# Patient Record
Sex: Female | Born: 1950 | Race: Black or African American | Hispanic: No | Marital: Single | Smoking: Never smoker
Health system: Southern US, Community
[De-identification: ages and names within clinical notes are randomized; demographics above are authoritative.]

## PROBLEM LIST (undated history)

## (undated) DIAGNOSIS — I10 Essential (primary) hypertension: Secondary | ICD-10-CM

## (undated) HISTORY — PX: ABDOMINAL HYSTERECTOMY: SHX81

---

## 1979-11-13 HISTORY — PX: TUBAL LIGATION: SHX77

## 2018-04-26 ENCOUNTER — Other Ambulatory Visit: Payer: Self-pay

## 2018-04-26 ENCOUNTER — Emergency Department (HOSPITAL_COMMUNITY): Payer: Medicare Other

## 2018-04-26 ENCOUNTER — Encounter (HOSPITAL_COMMUNITY): Payer: Self-pay

## 2018-04-26 ENCOUNTER — Emergency Department (HOSPITAL_COMMUNITY)
Admission: EM | Admit: 2018-04-26 | Discharge: 2018-04-26 | Disposition: A | Discharge status: Home / Self Care | Payer: Medicare Other | Attending: Emergency Medicine | Admitting: Emergency Medicine

## 2018-04-26 DIAGNOSIS — R55 Syncope and collapse: Secondary | ICD-10-CM

## 2018-04-26 DIAGNOSIS — I951 Orthostatic hypotension: Secondary | ICD-10-CM

## 2018-04-26 DIAGNOSIS — I10 Essential (primary) hypertension: Secondary | ICD-10-CM | POA: Insufficient documentation

## 2018-04-26 DIAGNOSIS — R569 Unspecified convulsions: Secondary | ICD-10-CM

## 2018-04-26 HISTORY — DX: Essential (primary) hypertension: I10

## 2018-04-26 LAB — CBC WITH DIFFERENTIAL/PLATELET
Abs Immature Granulocytes: 0.02 10*3/uL (ref 0.00–0.07)
BASOS PCT: 1 %
Basophils Absolute: 0 10*3/uL (ref 0.0–0.1)
EOS ABS: 0.1 10*3/uL (ref 0.0–0.5)
Eosinophils Relative: 2 %
HCT: 44.9 % (ref 36.0–46.0)
Hemoglobin: 14.2 g/dL (ref 12.0–15.0)
Immature Granulocytes: 0 %
Lymphocytes Relative: 17 %
Lymphs Abs: 0.9 10*3/uL (ref 0.7–4.0)
MCH: 27.7 pg (ref 26.0–34.0)
MCHC: 31.6 g/dL (ref 30.0–36.0)
MCV: 87.5 fL (ref 80.0–100.0)
Monocytes Absolute: 0.3 10*3/uL (ref 0.1–1.0)
Monocytes Relative: 6 %
NRBC: 0 % (ref 0.0–0.2)
Neutro Abs: 3.7 10*3/uL (ref 1.7–7.7)
Neutrophils Relative %: 74 %
PLATELETS: 225 10*3/uL (ref 150–400)
RBC: 5.13 MIL/uL — ABNORMAL HIGH (ref 3.87–5.11)
RDW: 13 % (ref 11.5–15.5)
WBC: 5 10*3/uL (ref 4.0–10.5)

## 2018-04-26 LAB — BASIC METABOLIC PANEL
Anion gap: 15 (ref 5–15)
BUN: 16 mg/dL (ref 8–23)
CO2: 22 mmol/L (ref 22–32)
CREATININE: 1.23 mg/dL — AB (ref 0.44–1.00)
Calcium: 9.1 mg/dL (ref 8.9–10.3)
Chloride: 103 mmol/L (ref 98–111)
GFR calc Af Amer: 53 mL/min — ABNORMAL LOW (ref >60)
GFR calc non Af Amer: 46 mL/min — ABNORMAL LOW (ref >60)
Glucose, Bld: 109 mg/dL — ABNORMAL HIGH (ref 70–99)
Potassium: 3.6 mmol/L (ref 3.5–5.1)
Sodium: 140 mmol/L (ref 135–145)

## 2018-04-26 LAB — URINALYSIS, ROUTINE W REFLEX MICROSCOPIC
Bilirubin Urine: NEGATIVE
Glucose, UA: NEGATIVE mg/dL
Ketones, ur: 80 mg/dL — AB
Nitrite: NEGATIVE
PROTEIN: 30 mg/dL — AB
Specific Gravity, Urine: 1.025 (ref 1.005–1.030)
pH: 5 (ref 5.0–8.0)

## 2018-04-26 LAB — CBG MONITORING, ED: Glucose-Capillary: 100 mg/dL — ABNORMAL HIGH (ref 70–99)

## 2018-04-26 LAB — I-STAT TROPONIN, ED: Troponin i, poc: 0 ng/mL (ref 0.00–0.08)

## 2018-04-26 MED ORDER — SODIUM CHLORIDE 0.9 % IV BOLUS
1000.0000 mL | Freq: Once | INTRAVENOUS | Status: AC
  Administered 2018-04-26: 1000 mL via INTRAVENOUS

## 2018-04-26 MED ORDER — SODIUM CHLORIDE 0.9 % IV BOLUS: 1000.0000 mL | Freq: Once | INTRAVENOUS | Status: DC

## 2018-04-26 NOTE — Discharge Instructions (Signed)
You came in due to fainting and probable seizure like activity. Your head CT scan and blood work were reassuring.  Please make sure to be seen by a neurologist.  Per [facility] [place] DM[place] statutes, patients with seizures are not allowed to drive until they have been seizure-free for six months. Please avoid driving for at least 6 months or until cleared by neurologist. Please eat and drink enough to avoid ow blood pressure or sugar. Return to emergency room if not improved or developed seizure, loss of consciousness, or other symptoms such as sever headache, chest pain or shortness of breath. Thanks

## 2018-04-26 NOTE — ED Triage Notes (Signed)
Pt states that she was lightheaded this morning and went to sit down. Pt remembers waking up on the floor with niece calling her name. Pt did not eat yesterday. Pt's niece witnessed incidence .Pt fell out of the chair onto the carpet. Pt was staring "like she wasn't there", like a focal seizure per niece. Pt did void on herself. Pt was diaphoretic per niece.  No hx of seizures

## 2018-04-26 NOTE — ED Notes (Signed)
Pt ambulated to restroom without assistance.

## 2018-04-26 NOTE — ED Provider Notes (Signed)
[facility]-EMERGENCY DEPT Provider Note   CSN: [id number] Arrival date & time: 04/26/18  1042   History   Chief Complaint Chief Complaint  Patient presents with  . Loss of Consciousness    HPI Aimee Johnson is a 67 y.o. female.  With past medical history of HTN, presented with fainting spell. She was at her niece's home, today when it happened.  She initially felt some generalized weakness and then fell on the floor.  Per her niece who is a IT sales professional, she found that her to have staring spell when on the floor, looking at her unresponsive.  She did not have any tonic-clonic movement but passed urine.  When she called her name, she got responsive and asked her why she is on the floor.  She was oriented but took about 1 to 2 minutes for her to back to herself. she did not hit her head.  Denies any  chest pain or . No palpitation, dizziness.  No nausea, vomiting or abdominal pain. She did not have any recent travel, leg pain or leg swelling or shortness of breath. She tries to have a healthy diet and endorses that she did not eat yesterday because she did not have any food available other than junk food.    HPI  Past Medical History:  Diagnosis Date  . Hypertension     There are no active problems to display for this patient.   History reviewed. No pertinent surgical history.   OB History   No obstetric history on file.      Home Medications    Prior to Admission medications   Not on File    Family History No family history on file.  Social History Social History   Tobacco Use  . Smoking status: Never Smoker  . Smokeless tobacco: Never Used  Substance Use Topics  . Alcohol use: Never    Frequency: Never  . Drug use: Never     Allergies   Patient has no known allergies.   Review of Systems Review of Systems   Physical Exam Updated Vital Signs BP (!) 153/85   Pulse 72   Temp 97.8 F (36.6 C) (Oral)   Resp 10   Ht 5\' 6"   (1.676 m)   Wt 71.7 kg   SpO2 100%   BMI 25.50 kg/m   Physical Exam Vitals signs and nursing note reviewed.  Constitutional:      General: She is not in acute distress.    Appearance: Normal appearance.  HENT:     Head: Normocephalic and atraumatic.  Eyes:     Extraocular Movements: Extraocular movements intact.     Pupils: Pupils are equal, round, and reactive to light.  Cardiovascular:     Rate and Rhythm: Normal rate and regular rhythm.     Heart sounds: No murmur.  Abdominal:     General: Bowel sounds are normal.     Palpations: Abdomen is soft.     Tenderness: There is no abdominal tenderness.  Musculoskeletal: Normal range of motion.  Neurological:     General: No focal deficit present.     Mental Status: She is alert and oriented to person, place, and time.  Psychiatric:        Mood and Affect: Mood normal.        Behavior: Behavior normal.      ED Treatments / Results  Labs (all labs ordered are listed, but only abnormal results are displayed) Labs Reviewed  BASIC  METABOLIC PANEL - Abnormal; Notable for the following components:      Result Value   Glucose, Bld 109 (*)    Creatinine, Ser 1.23 (*)    GFR calc non Af Amer 46 (*)    GFR calc Af Amer 53 (*)    All other components within normal limits  URINALYSIS, ROUTINE W REFLEX MICROSCOPIC - Abnormal; Notable for the following components:   APPearance HAZY (*)    Hgb urine dipstick MODERATE (*)    Ketones, ur 80 (*)    Protein, ur 30 (*)    Leukocytes, UA TRACE (*)    Bacteria, UA RARE (*)    All other components within normal limits  CBC WITH DIFFERENTIAL/PLATELET - Abnormal; Notable for the following components:   RBC 5.13 (*)    All other components within normal limits  CBG MONITORING, ED - Abnormal; Notable for the following components:   Glucose-Capillary 100 (*)    All other components within normal limits  I-STAT TROPONIN, ED    EKG None  Radiology Ct Head Wo Contrast  Result Date:  04/26/2018 CLINICAL DATA:  Syncope. EXAM: CT HEAD WITHOUT CONTRAST TECHNIQUE: Contiguous axial images were obtained from the base of the skull through the vertex without intravenous contrast. COMPARISON:  None. FINDINGS: Brain: No evidence of acute infarction, hemorrhage, hydrocephalus, extra-axial collection or mass lesion/mass effect. Vascular: No hyperdense vessel or unexpected calcification. Skull: No evidence for fracture. No worrisome lytic or sclerotic lesion. Sinuses/Orbits: The visualized paranasal sinuses and mastoid air cells are clear. Visualized portions of the globes and intraorbital fat are unremarkable. Other: None. IMPRESSION: No acute intracranial abnormality. Electronically Signed   By: Kennith CenterEric  Mansell M.D.   On: 04/26/2018 13:38    Procedures Procedures (including critical care time)  Medications Ordered in ED Medications  sodium chloride 0.9 % bolus 1,000 mL (0 mLs Intravenous Stopped 04/26/18 1326)     Initial Impression / Assessment and Plan / ED Course  I have reviewed the triage vital signs and the nursing notes.  Pertinent labs & imaging results that were available during my care of the patient were reviewed by me and considered in my medical decision making (see chart for details).     67 y/o F with PMHx of HTN, presented with near syncope. History is significant for stairing spell and urinary incontinency. Concerning for possible seizure.  Electrolyte normal. Has +orthostatic vs. And reports that she did not eat yesterday.   -NS 1 li bolus -CT head -CBG  -istat trop--> 0.0 -EKG -orthostatic vs -U/A Update: Remained stable and symptoms free. EKG and Trop negative. No electrolyte abnormality. CT scan with NO acute intracranial abnormality. Result discussed with her and her family.  Her symptoms might be due to seizure like activity versus orthostatic hypotension.  ED return precautions provided. They agree to be discharge and follow up with neurology. Provided  information about Seizure precautions, and avoiding driving for at least  6 months or until be cleared by neurology.    Final Clinical Impressions(s) / ED Diagnoses   Final diagnoses:  Near syncope  Orthostasis  Seizure-like activity [facility](HCC)    ED Discharge Orders    None       Chevis PrettyMasoudi, Minnette Merida, MD 04/26/18 1504    Charlynne PanderYao, David Hsienta, MD 04/28/18 0630

## 2018-04-26 NOTE — ED Notes (Signed)
Patient transported to CT 

## 2018-04-26 NOTE — ED Notes (Signed)
Patient unable to urinate at this time. Patient will call out when ready to void.

## 2018-04-29 ENCOUNTER — Ambulatory Visit (INDEPENDENT_AMBULATORY_CARE_PROVIDER_SITE_OTHER): Payer: Medicare Other | Admitting: Diagnostic Neuroimaging

## 2018-04-29 ENCOUNTER — Encounter: Payer: Self-pay | Admitting: Diagnostic Neuroimaging

## 2018-04-29 ENCOUNTER — Telehealth: Payer: Self-pay | Admitting: Diagnostic Neuroimaging

## 2018-04-29 VITALS — BP 161/88 | HR 82 | Ht 66.0 in | Wt 168.4 lb

## 2018-04-29 DIAGNOSIS — R4020 Unspecified coma: Secondary | ICD-10-CM | POA: Diagnosis not present

## 2018-04-29 DIAGNOSIS — R55 Syncope and collapse: Secondary | ICD-10-CM

## 2018-04-29 DIAGNOSIS — R6889 Other general symptoms and signs: Secondary | ICD-10-CM

## 2018-04-29 NOTE — Telephone Encounter (Signed)
Medicare/aarp order sent to GI. No auth they will reach out to the pt to schedule.  °

## 2018-04-29 NOTE — Progress Notes (Signed)
[facility]  PATIENT: Aimee Johnson DOB: [birth year]  REFERRING CLINICIAN: ER  HISTORY FROM: patient  REASON FOR VISIT: new consult    HISTORICAL  CHIEF COMPLAINT:  Chief Complaint  Patient presents with  . Seizures    rm 6, New Pt, "I fainted on 04/26/18, had urinary incontinence, was taken to ED to be checked"    HISTORY OF PRESENT ILLNESS:   67 year old female here for evaluation of syncope.  04/26/2018 patient was at home, went to sit down, was noted to be staring and confused.  Apparently she fell out of the chair, had eyes open and staring.  She had urinary incontinence.  Patient was sweating profusely.  Return to consciousness within a few minutes.  This was witnessed by her niece who is an EMT, who was concerned it may have been a seizure. No witnessed tonic or clonic activity. Patient was taken to ER: 04/26/18: ortho vitals: 126/79, 70 lying --> 108/84, 98 standing.  Patient was diagnosed with possible dehydration as cause for seizure.  Of note patient did not have anything to eat the day before this event happened.  Since that time patient is doing well.  No further events.  No prior history of syncope or seizure.    REVIEW OF SYSTEMS: Full 14 system review of systems performed and negative with exception of: Only as per HPI.  ALLERGIES: No Known Allergies  HOME MEDICATIONS: Outpatient Medications Prior to Visit  Medication Sig Dispense Refill  . Multiple Vitamin (MULTIVITAMIN) tablet Take by mouth daily.    Marland Kitchen UNABLE TO FIND Med Name: tumeric     No facility-administered medications prior to visit.     PAST MEDICAL HISTORY: Past Medical History:  Diagnosis Date  . Hypertension     PAST SURGICAL HISTORY: Past Surgical History:  Procedure Laterality Date  . ABDOMINAL HYSTERECTOMY    . TUBAL LIGATION  11/1979    FAMILY HISTORY: Family History  Problem Relation Age of Onset  . Diabetes Father   . Cancer Brother     SOCIAL  HISTORY: Social History   Socioeconomic History  . Marital status: Single    Spouse name: Not on file  . Number of children: 3  . Years of education: Not on file  . Highest education level: Bachelor's degree (e.g., BA, AB, BS)  Occupational History    Comment: retired  Engineer, production  . Financial resource strain: Not on file  . Food insecurity:    Worry: Not on file    Inability: Not on file  . Transportation needs:    Medical: Not on file    Non-medical: Not on file  Tobacco Use  . Smoking status: Never Smoker  . Smokeless tobacco: Never Used  Substance and Sexual Activity  . Alcohol use: Never    Frequency: Never  . Drug use: Never  . Sexual activity: Not on file  Lifestyle  . Physical activity:    Days per week: Not on file    Minutes per session: Not on file  . Stress: Not on file  Relationships  . Social connections:    Talks on phone: Not on file    Gets together: Not on file    Attends religious service: Not on file    Active member of club or organization: Not on file    Attends meetings of clubs or organizations: Not on file    Relationship status: Not on file  . Intimate partner violence:    Fear of  current or ex partner: Not on file    Emotionally abused: Not on file    Physically abused: Not on file    Forced sexual activity: Not on file  Other Topics Concern  . Not on file  Social History Narrative   Live with daughters on [place], with niece now 04/29/18   Caffeine- herbal tea     PHYSICAL EXAM  GENERAL EXAM/CONSTITUTIONAL: Vitals:  Vitals:   04/29/18 1102  BP: (!) 161/88  Pulse: 82  Weight: 168 lb 6.4 oz (76.4 kg)  Height: 5\' 6"  (1.676 m)    No data found.      Body mass index is 27.18 kg/m. Wt Readings from Last 3 Encounters:  04/29/18 168 lb 6.4 oz (76.4 kg)  04/26/18 158 lb (71.7 kg)     Patient is in no distress; well developed, nourished and groomed; neck is supple  CARDIOVASCULAR:  Examination of carotid arteries  is normal; no carotid bruits  Regular rate and rhythm, no murmurs  Examination of peripheral vascular system by observation and palpation is normal  EYES:  Ophthalmoscopic exam of optic discs and posterior segments is normal; no papilledema or hemorrhages  Visual Acuity Screening   Right eye Left eye Both eyes  Without correction: 20/30 20/40   With correction:        MUSCULOSKELETAL:  Gait, strength, tone, movements noted in Neurologic exam below  NEUROLOGIC: MENTAL STATUS:  No flowsheet data found.  awake, alert, oriented to person, place and time  recent and remote memory intact  normal attention and concentration  language fluent, comprehension intact, naming intact  fund of knowledge appropriate  CRANIAL NERVE:   2nd - no papilledema on fundoscopic exam  2nd, 3rd, 4th, 6th - pupils equal and reactive to light, visual fields full to confrontation, extraocular muscles intact, no nystagmus  5th - facial sensation symmetric  7th - facial strength symmetric  8th - hearing intact  9th - palate elevates symmetrically, uvula midline  11th - shoulder shrug symmetric  12th - tongue protrusion midline  MOTOR:   normal bulk and tone, full strength in the BUE, BLE  SENSORY:   normal and symmetric to light touch, temperature, vibration  COORDINATION:   finger-nose-finger, fine finger movements normal  REFLEXES:   deep tendon reflexes present and symmetric  GAIT/STATION:   narrow based gait; romberg is negative     DIAGNOSTIC DATA (LABS, IMAGING, TESTING) - I reviewed patient records, labs, notes, testing and imaging myself where available.  Lab Results  Component Value Date   WBC 5.0 04/26/2018   HGB 14.2 04/26/2018   HCT 44.9 04/26/2018   MCV 87.5 04/26/2018   PLT 225 04/26/2018      Component Value Date/Time   NA 140 04/26/2018 1126   K 3.6 04/26/2018 1126   CL 103 04/26/2018 1126   CO2 22 04/26/2018 1126   GLUCOSE 109 (H)  04/26/2018 1126   BUN 16 04/26/2018 1126   CREATININE 1.23 (H) 04/26/2018 1126   CALCIUM 9.1 04/26/2018 1126   GFRNONAA 46 (L) 04/26/2018 1126   GFRAA 53 (L) 04/26/2018 1126   No results found for: CHOL, HDL, LDLCALC, LDLDIRECT, TRIG, CHOLHDL No results found for: RUEA5W No results found for: VITAMINB12 No results found for: TSH   04/26/18 CT head [I reviewed images myself and agree with interpretation. -VRP]  - No acute intracranial abnormality.    ASSESSMENT AND PLAN  67 y.o. year old female here with new onset loss of consciousness,  seizure versus syncope.  Will complete work-up.  Dx:  1. Syncope, unspecified syncope type   2. Loss of consciousness (HCC)   3. Other general symptoms and signs      PLAN:  UNPROVOKED SEIZURE vs SYNCOPE - CBC, CMP, B12, A1c, TSH - MRI brain, EEG - According to [place] law, you can not drive unless you are seizure / syncope free for at least 6 months and under physician's care.  - Please maintain precautions. Do not participate in activities where a loss of awareness could harm you or someone else. No swimming alone, no tub bathing, no hot tubs, no driving, no operating motorized vehicles (cars, ATVs, motocycles, etc), lawnmowers, power tools or firearms. No standing at heights, such as rooftops, ladders or stairs. Avoid hot objects such as stoves, heaters, open fires. Wear a helmet when riding a bicycle, scooter, skateboard, etc. and avoid areas of traffic. Set your water heater to 120 degrees or less.  HYPERTENSION - follow up with PCP  RENAL INSUFFIENCEY  - follow up with PCP  Orders Placed This Encounter  Procedures  . MR BRAIN W WO CONTRAST  . CBC with Differential/Platelet  . Comprehensive metabolic panel  . Hemoglobin A1c  . Vitamin B12  . TSH  . EEG adult   Return in about 6 months (around 10/29/2018).  I reviewed images, labs, notes, records myself. I summarized findings and reviewed with patient, for this high risk condition  (syncope / seizure) requiring high complexity decision making.     Suanne MarkerVIKRAM R. , MD 04/29/2018, 11:37 AM Certified in Neurology, Neurophysiology and Neuroimaging  [facility] [address], [address] [place], [place] [address] [phone]

## 2018-04-29 NOTE — Patient Instructions (Signed)
-   CBC, CMP, B12, A1c, TSH  - MRI brain, EEG  - According to [place] law, you can not drive unless you are seizure / syncope free for at least 6 months and under physician's care.   - Please maintain precautions. Do not participate in activities where a loss of awareness could harm you or someone else. No swimming alone, no tub bathing, no hot tubs, no driving, no operating motorized vehicles (cars, ATVs, motocycles, etc), lawnmowers, power tools or firearms. No standing at heights, such as rooftops, ladders or stairs. Avoid hot objects such as stoves, heaters, open fires. Wear a helmet when riding a bicycle, scooter, skateboard, etc. and avoid areas of traffic. Set your water heater to 120 degrees or less.

## 2018-04-29 NOTE — Telephone Encounter (Signed)
lvm for patient to be aware I gave her GI phone number of [phone] and to give them a call if she has not heard in the next 2-3 business days.

## 2018-04-30 LAB — COMPREHENSIVE METABOLIC PANEL
ALT: 10 IU/L (ref 0–32)
AST: 17 IU/L (ref 0–40)
Albumin/Globulin Ratio: 1.8 (ref 1.2–2.2)
Albumin: 4.1 g/dL (ref 3.6–4.8)
Alkaline Phosphatase: 51 IU/L (ref 39–117)
BUN/Creatinine Ratio: 13 (ref 12–28)
BUN: 11 mg/dL (ref 8–27)
Bilirubin Total: <0.2 mg/dL (ref 0.0–1.2)
CALCIUM: 8.9 mg/dL (ref 8.7–10.3)
CO2: 23 mmol/L (ref 20–29)
Chloride: 106 mmol/L (ref 96–106)
Creatinine, Ser: 0.82 mg/dL (ref 0.57–1.00)
GFR, EST AFRICAN AMERICAN: 86 mL/min/{1.73_m2} (ref >59)
GFR, EST NON AFRICAN AMERICAN: 75 mL/min/{1.73_m2} (ref >59)
Globulin, Total: 2.3 g/dL (ref 1.5–4.5)
Glucose: 104 mg/dL — ABNORMAL HIGH (ref 65–99)
Potassium: 4.8 mmol/L (ref 3.5–5.2)
Sodium: 142 mmol/L (ref 134–144)
Total Protein: 6.4 g/dL (ref 6.0–8.5)

## 2018-04-30 LAB — CBC WITH DIFFERENTIAL/PLATELET
BASOS: 1 %
Basophils Absolute: 0 10*3/uL (ref 0.0–0.2)
EOS (ABSOLUTE): 0.1 10*3/uL (ref 0.0–0.4)
EOS: 2 %
Hematocrit: 36.6 % (ref 34.0–46.6)
Hemoglobin: 11.7 g/dL (ref 11.1–15.9)
Immature Grans (Abs): 0 10*3/uL (ref 0.0–0.1)
Immature Granulocytes: 0 %
Lymphocytes Absolute: 1.4 10*3/uL (ref 0.7–3.1)
Lymphs: 35 %
MCH: 27.5 pg (ref 26.6–33.0)
MCHC: 32 g/dL (ref 31.5–35.7)
MCV: 86 fL (ref 79–97)
Monocytes Absolute: 0.3 10*3/uL (ref 0.1–0.9)
Monocytes: 8 %
Neutrophils Absolute: 2.2 10*3/uL (ref 1.4–7.0)
Neutrophils: 54 %
Platelets: 224 10*3/uL (ref 150–450)
RBC: 4.26 x10E6/uL (ref 3.77–5.28)
RDW: 13.3 % (ref 12.3–15.4)
WBC: 3.9 10*3/uL (ref 3.4–10.8)

## 2018-04-30 LAB — HEMOGLOBIN A1C
Est. average glucose Bld gHb Est-mCnc: 103 mg/dL
Hgb A1c MFr Bld: 5.2 % (ref 4.8–5.6)

## 2018-04-30 LAB — VITAMIN B12: Vitamin B-12: 1466 pg/mL — ABNORMAL HIGH (ref 232–1245)

## 2018-04-30 LAB — TSH: TSH: 2.1 u[IU]/mL (ref 0.450–4.500)

## 2018-05-03 ENCOUNTER — Telehealth: Payer: Self-pay | Admitting: *Deleted

## 2018-05-03 NOTE — Telephone Encounter (Signed)
LVM informing patient her labs are normal. Advised the office is closed, opens Mon, left number.

## 2018-05-11 ENCOUNTER — Ambulatory Visit
Admission: RE | Admit: 2018-05-11 | Discharge: 2018-05-11 | Disposition: A | Discharge status: Home / Self Care | Payer: Medicare Other | Source: Ambulatory Visit | Attending: Diagnostic Neuroimaging | Admitting: Diagnostic Neuroimaging

## 2018-05-11 DIAGNOSIS — R4020 Unspecified coma: Secondary | ICD-10-CM

## 2018-05-11 DIAGNOSIS — R55 Syncope and collapse: Secondary | ICD-10-CM

## 2018-05-11 MED ORDER — GADOBENATE DIMEGLUMINE 529 MG/ML IV SOLN
15.0000 mL | Freq: Once | INTRAVENOUS | Status: AC | PRN
  Administered 2018-05-11: 15 mL via INTRAVENOUS

## 2018-05-13 ENCOUNTER — Encounter: Payer: Self-pay | Admitting: Diagnostic Neuroimaging

## 2018-05-20 ENCOUNTER — Telehealth: Payer: Self-pay | Admitting: Diagnostic Neuroimaging

## 2018-05-20 NOTE — Telephone Encounter (Signed)
Spoke to pt and relayed that her MRI Brain was normal study. She verbalized understanding.

## 2018-05-20 NOTE — Telephone Encounter (Signed)
Pt calling for MRI results

## 2018-05-20 NOTE — Telephone Encounter (Signed)
Pt is calling requesting MRI results. Please advise °

## 2018-05-31 ENCOUNTER — Telehealth: Payer: Self-pay | Admitting: *Deleted

## 2018-05-31 NOTE — Telephone Encounter (Signed)
LMOM with below MRI brain results (unremarkable). She should continue current tx. plan; no changes.  She does not need to return this call unless she has questions/fim

## 2018-05-31 NOTE — Telephone Encounter (Signed)
-----   Message from Suanne Marker, MD sent at 05/30/2018  7:40 PM EST ----- Unremarkable imaging results. Please call patient. Continue current plan. -VRP

## 2018-06-18 ENCOUNTER — Ambulatory Visit (INDEPENDENT_AMBULATORY_CARE_PROVIDER_SITE_OTHER): Payer: Medicare Other | Admitting: Diagnostic Neuroimaging

## 2018-06-18 DIAGNOSIS — R55 Syncope and collapse: Secondary | ICD-10-CM | POA: Diagnosis not present

## 2018-06-18 DIAGNOSIS — R4020 Unspecified coma: Secondary | ICD-10-CM

## 2018-06-26 ENCOUNTER — Telehealth: Payer: Self-pay | Admitting: Diagnostic Neuroimaging

## 2018-06-26 NOTE — Telephone Encounter (Signed)
Pt request EEG results °

## 2018-06-27 NOTE — Procedures (Signed)
   [facility]  EEG (ELECTROENCEPHALOGRAM) REPORT   STUDY DATE: 06/18/18 PATIENT NAME: Aimee Johnson DOB: May 20, 1950 MRN: [id number]  ORDERING CLINICIAN: Joycelyn Schmid, MD   TECHNOLOGIST: Charlett Blake TECHNIQUE: Electroencephalogram was recorded utilizing standard 10-20 system of lead placement and reformatted into average and bipolar montages.  RECORDING TIME: 23 minutes  ACTIVATION: hyperventilation and photic stimulation  CLINICAL INFORMATION: 68 year old female with syncope vs seizure.  FINDINGS: Posterior dominant background rhythms, which attenuate with eye opening, ranging 11-12 hertz and 50-50 microvolts. No focal, lateralizing, epileptiform activity or seizures are seen. Patient recorded in the awake and drowsy state. EKG channel shows regular rhythm of 65-70 beats per minute.   IMPRESSION:   Normal EEG in the awake and drowsy states.    INTERPRETING PHYSICIAN:  Suanne Marker, MD Certified in Neurology, Neurophysiology and Neuroimaging  [facility] [address] [place], [place] [address] [phone]

## 2018-06-28 NOTE — Telephone Encounter (Signed)
Spoke with patient and informed her Dr Marjory Lies stated no driving until event/syncope free x 6 months from the date of her episode/event. Patient verbalized understanding, appreciation.

## 2018-06-28 NOTE — Telephone Encounter (Signed)
No driving until 6 months event free. -VRP

## 2018-06-28 NOTE — Telephone Encounter (Signed)
Called patient and informed her that her EEG was normal. She asked to be released to drive. I stated that the [place] law states she must be seizure/syncope free for 6 months and under physician's care. She stated she was told that she could resume driving if the neurologist released her. I advised will discuss with Dr Marjory Lies and let her know. She verbalized understanding, appreciation.

## 2018-06-28 NOTE — Telephone Encounter (Signed)
Normal. -VRP 

## 2018-09-26 ENCOUNTER — Telehealth: Payer: Self-pay | Admitting: *Deleted

## 2018-09-26 NOTE — Telephone Encounter (Signed)
Spoke with patient and updated EMR. 

## 2018-09-26 NOTE — Telephone Encounter (Signed)
5-14 Pt has called and gave verbal consent to file insurance for [email or web address] Pt confirmed email as [email or web address]  Pt understands that although there may be some limitations with this type of visit, we will take all precautions to reduce any security or privacy concerns.  Pt understands that this will be treated like an in office visit and we will file with pt's insurance, and there may be a patient responsible charge related to this service. *e-mail has been sent to confirmed e-mail address*

## 2018-09-26 NOTE — Telephone Encounter (Signed)
LVM requesting call back to discuss converting to video visit due to current COVID 19 pandemic. Also advised we can move appt to next week.

## 2018-09-30 ENCOUNTER — Ambulatory Visit (INDEPENDENT_AMBULATORY_CARE_PROVIDER_SITE_OTHER): Payer: Medicare Other | Admitting: Diagnostic Neuroimaging

## 2018-09-30 ENCOUNTER — Encounter: Payer: Self-pay | Admitting: Diagnostic Neuroimaging

## 2018-09-30 ENCOUNTER — Other Ambulatory Visit: Payer: Self-pay

## 2018-09-30 DIAGNOSIS — R55 Syncope and collapse: Secondary | ICD-10-CM | POA: Diagnosis not present

## 2018-09-30 DIAGNOSIS — R4020 Unspecified coma: Secondary | ICD-10-CM

## 2018-09-30 NOTE — Progress Notes (Signed)
    Virtual Visit via Video Note  I connected with Aimee Johnson on 09/30/18 at  2:00 PM EDT by a video enabled telemedicine application and verified that I am speaking with the correct person using two identifiers.   I discussed the limitations of evaluation and management by telemedicine and the availability of in person appointments. The patient expressed understanding and agreed to proceed.  Patient is at their home. I am at the office.    History of Present Illness:   UPDATE (09/30/18, VRP): Since last visit, doing well. Symptoms are resolved. No further events. Overall feels well.  PRIOR HPI: 68 year old female here for evaluation of syncope.  04/26/2018 patient was at home, went to sit down, was noted to be staring and confused.  Apparently she fell out of the chair, had eyes open and staring.  She had urinary incontinence.  Patient was sweating profusely.  Return to consciousness within a few minutes.  This was witnessed by her niece who is an EMT, who was concerned it may have been a seizure. No witnessed tonic or clonic activity. Patient was taken to ER: 04/26/18: ortho vitals: 126/79, 70 lying --> 108/84, 98 standing.  Patient was diagnosed with possible dehydration as cause for seizure.  Of note patient did not have anything to eat the day before this event happened.  Since that time patient is doing well.  No further events.  No prior history of syncope or seizure.    Observations/Objective:  - awake, alert - face symm - no dysarthria   Assessment and Plan:  Dx:  1. Syncope, unspecified syncope type   2. Loss of consciousness (HCC)    UNPROVOKED SYNCOPE (seizure less likely)  - monitor BP at home  - establish with PCP  - According to [place] law, you can not drive unless you are seizure / syncope free for at least 6 months and under physician's care. (last event on 04/26/18)  - Please maintain precautions. Do not participate in activities where a loss of  awareness could harm you or someone else. No swimming alone, no tub bathing, no hot tubs, no driving, no operating motorized vehicles (cars, ATVs, motocycles, etc), lawnmowers, power tools or firearms. No standing at heights, such as rooftops, ladders or stairs. Avoid hot objects such as stoves, heaters, open fires. Wear a helmet when riding a bicycle, scooter, skateboard, etc. and avoid areas of traffic. Set your water heater to 120 degrees or less.    Follow Up Instructions:  - Return for return to PCP, pending if symptoms worsen or fail to improve.    I discussed the assessment and treatment plan with the patient. The patient was provided an opportunity to ask questions and all were answered. The patient agreed with the plan and demonstrated an understanding of the instructions.   The patient was advised to call back or seek an in-person evaluation if the symptoms worsen or if the condition fails to improve as anticipated.  I provided 20 minutes of non-face-to-face time during this encounter.   Suanne Marker, MD 09/30/2018, 2:06 PM Certified in Neurology, Neurophysiology and Neuroimaging  [facility] [address], [address] [place], [place] [address] [phone]

## 2018-10-30 ENCOUNTER — Ambulatory Visit: Payer: Medicare Other | Admitting: Diagnostic Neuroimaging

## 2019-12-16 IMAGING — CT CT HEAD W/O CM
3 series · 14 of 47 positions shown, 16 images · non-contrast
Comparison: None.

CLINICAL DATA: Syncope.

EXAM:
CT HEAD WITHOUT CONTRAST
TECHNIQUE: Contiguous axial images were obtained from the base of the skull
through the vertex without intravenous contrast.

[Series 2: head wo · axial · 0.47mm/px · z∈[+1462,+1587]mm · 8 of 30 slices shown, 10 images]
[im 3/30  brain]
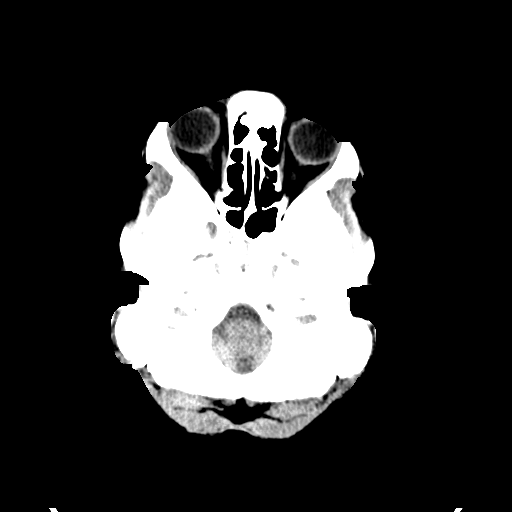
[im 3/30  bone]
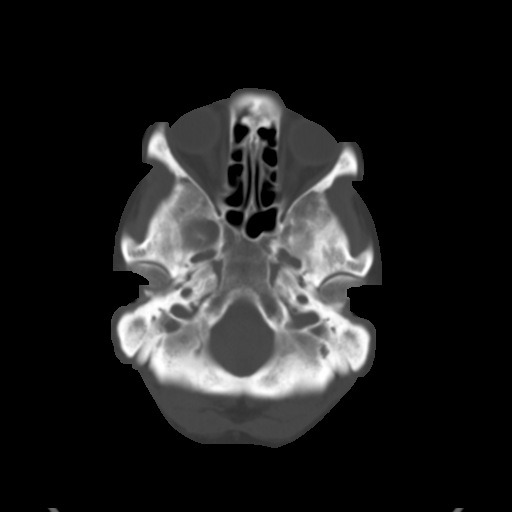
[im 7/30  brain]
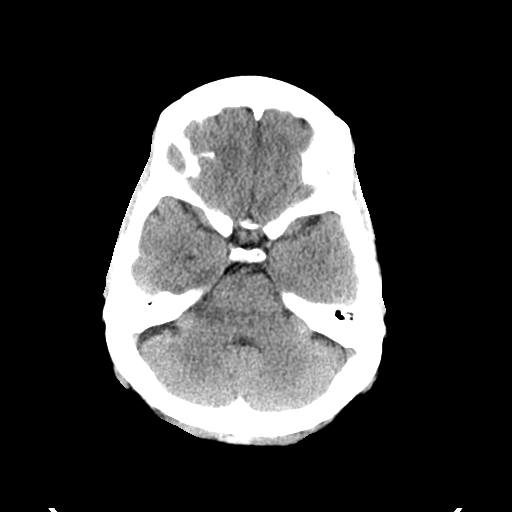
[im 10/30  brain]
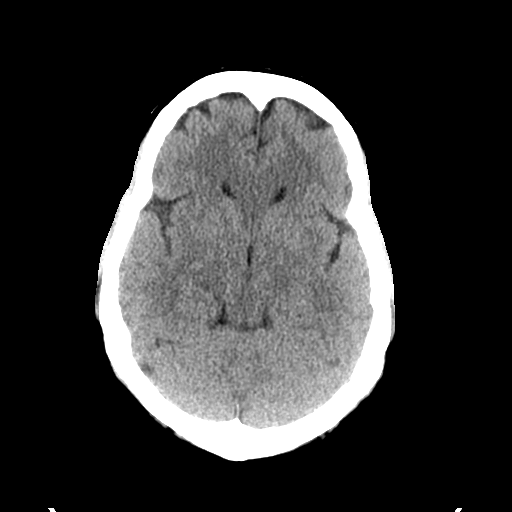
[im 14/30  brain]
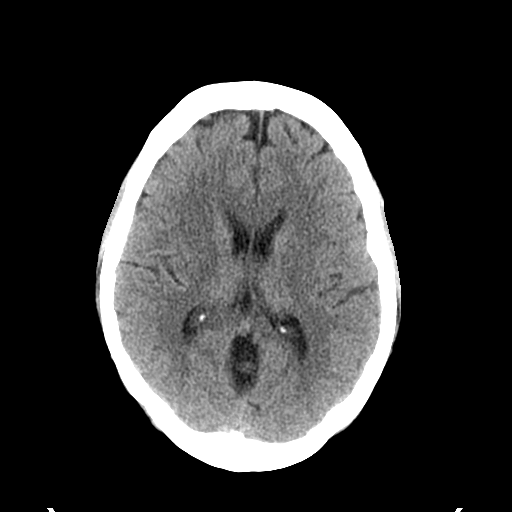
[im 17/30  brain]
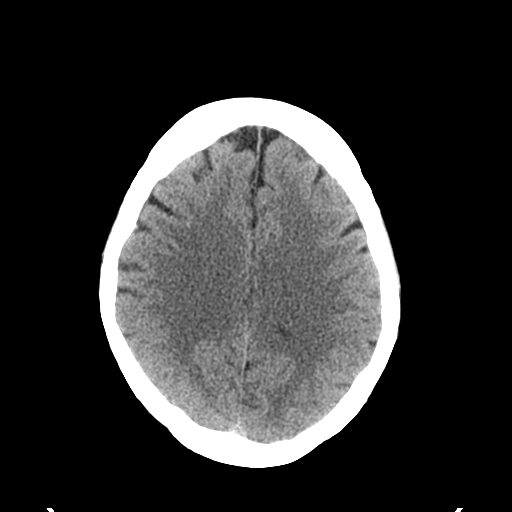
[im 17/30  bone]
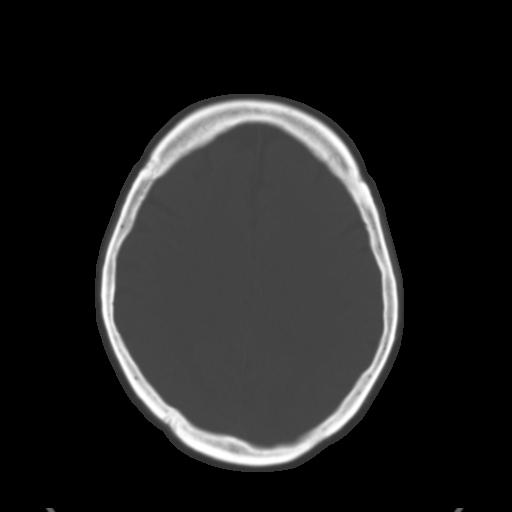
[im 21/30  brain]
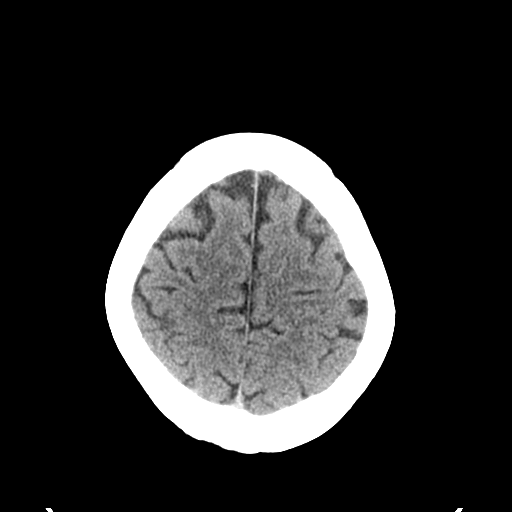
[im 24/30  brain]
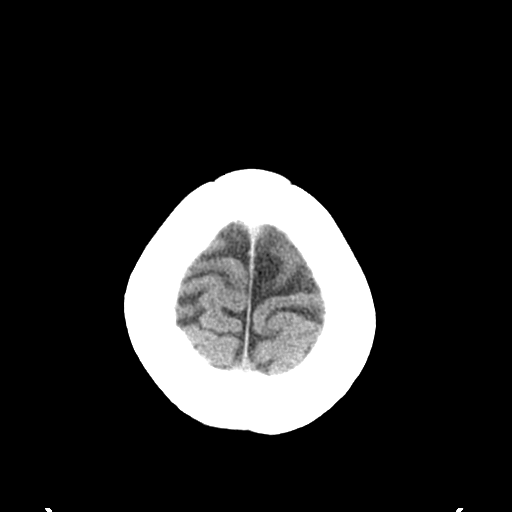
[im 28/30  brain]
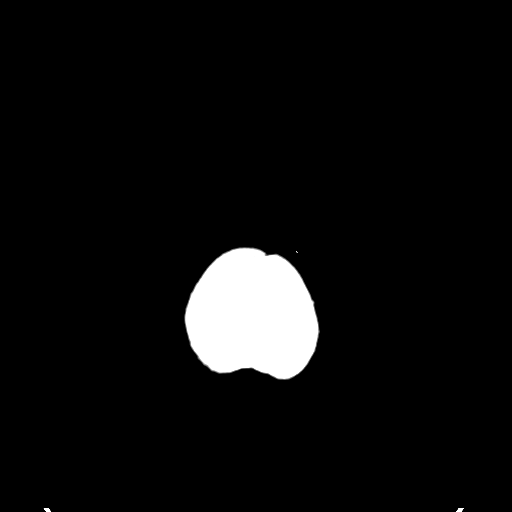

[Series 4: coronal soft tissue · coronal · 0.29mm/px · 3 of 80 slices shown]
[im 27/80  brain]
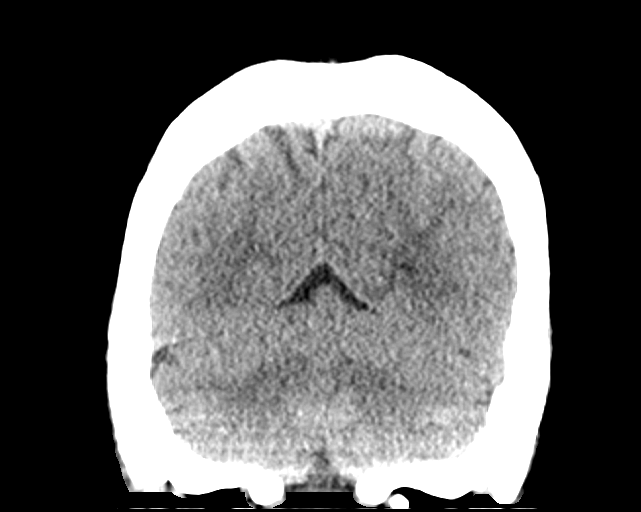
[im 36/80  brain]
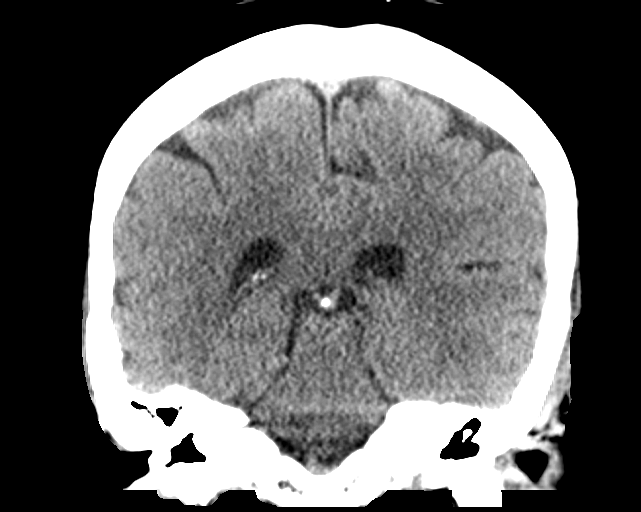
[im 44/80  brain]
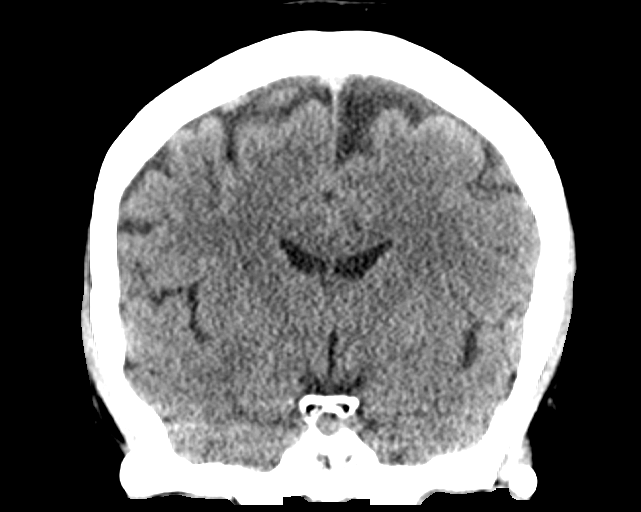

[Series 5: sagittal soft tissue · sagittal · 0.29mm/px · 3 of 57 slices shown]
[im 19/57  brain]
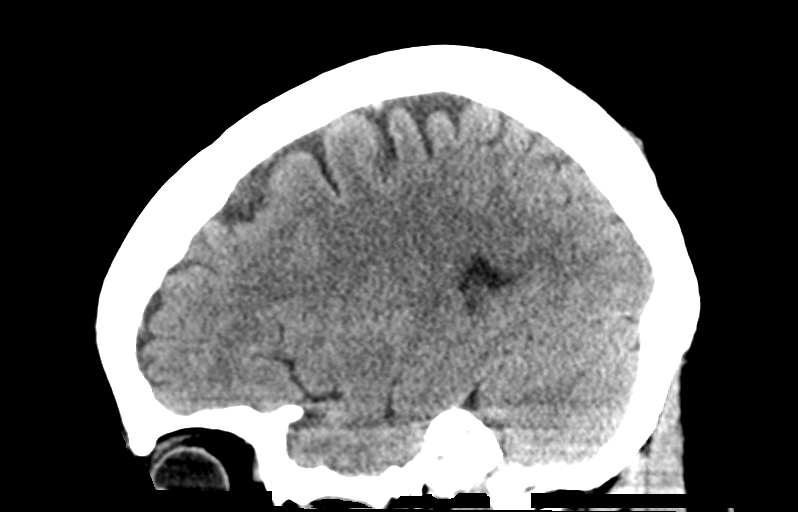
[im 29/57  brain]
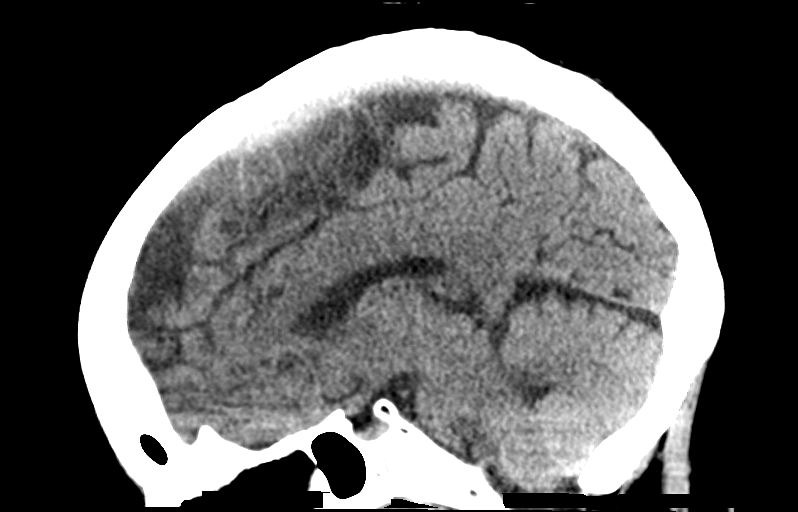
[im 38/57  brain]
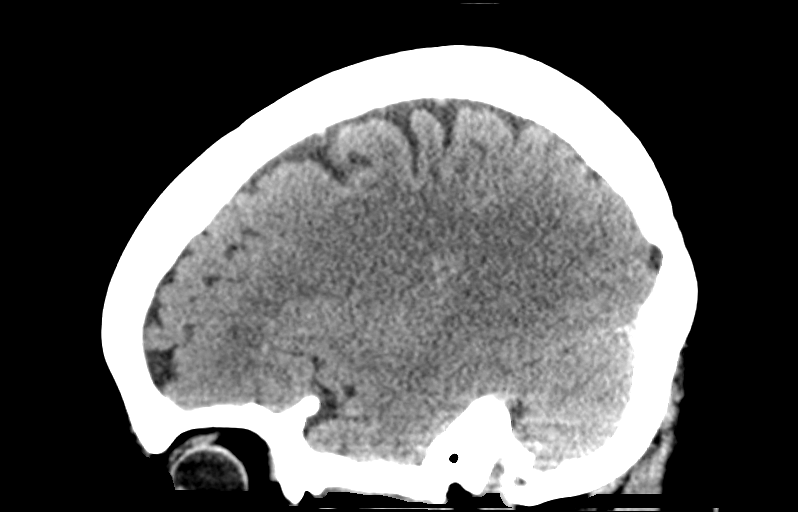

[14 of 47 positions shown; findings below may reference images not displayed]

FINDINGS: Brain: No evidence of acute infarction, hemorrhage, hydrocephalus,
extra-axial collection or mass lesion/mass effect.

Vascular: No hyperdense vessel or unexpected calcification.

Skull: No evidence for fracture. No worrisome lytic or sclerotic
lesion.

Sinuses/Orbits: The visualized paranasal sinuses and mastoid air
cells are clear. Visualized portions of the globes and intraorbital
fat are unremarkable.

Other: None.
IMPRESSION: No acute intracranial abnormality.

## 2022-05-16 ENCOUNTER — Other Ambulatory Visit: Payer: Self-pay

## 2022-05-16 ENCOUNTER — Other Ambulatory Visit: Payer: Self-pay | Admitting: Geriatric Medicine

## 2022-05-16 DIAGNOSIS — Z13820 Encounter for screening for osteoporosis: Secondary | ICD-10-CM

## 2022-05-16 DIAGNOSIS — Z1231 Encounter for screening mammogram for malignant neoplasm of breast: Secondary | ICD-10-CM

## 2022-11-02 ENCOUNTER — Ambulatory Visit
Admission: RE | Admit: 2022-11-02 | Discharge: 2022-11-02 | Disposition: A | Discharge status: Home / Self Care | Payer: Medicare HMO | Source: Ambulatory Visit | Attending: Geriatric Medicine | Admitting: Geriatric Medicine

## 2022-11-02 ENCOUNTER — Ambulatory Visit
Admission: RE | Admit: 2022-11-02 | Discharge: 2022-11-02 | Disposition: A | Discharge status: Home / Self Care | Payer: Medicare HMO | Source: Ambulatory Visit | Attending: Internal Medicine | Admitting: Internal Medicine

## 2022-11-02 DIAGNOSIS — Z13820 Encounter for screening for osteoporosis: Secondary | ICD-10-CM

## 2022-11-02 DIAGNOSIS — Z1231 Encounter for screening mammogram for malignant neoplasm of breast: Secondary | ICD-10-CM

## 2022-11-08 ENCOUNTER — Other Ambulatory Visit: Payer: Self-pay | Admitting: Internal Medicine

## 2022-11-08 DIAGNOSIS — R928 Other abnormal and inconclusive findings on diagnostic imaging of breast: Secondary | ICD-10-CM

## 2022-11-17 ENCOUNTER — Ambulatory Visit
Admission: RE | Admit: 2022-11-17 | Discharge: 2022-11-17 | Disposition: A | Discharge status: Home / Self Care | Payer: Medicare HMO | Source: Ambulatory Visit | Attending: Internal Medicine | Admitting: Internal Medicine

## 2022-11-17 DIAGNOSIS — R928 Other abnormal and inconclusive findings on diagnostic imaging of breast: Secondary | ICD-10-CM

## 2022-11-20 ENCOUNTER — Other Ambulatory Visit: Payer: Self-pay | Admitting: Internal Medicine

## 2022-11-20 DIAGNOSIS — N6320 Unspecified lump in the left breast, unspecified quadrant: Secondary | ICD-10-CM

## 2023-05-22 ENCOUNTER — Ambulatory Visit
Admission: RE | Admit: 2023-05-22 | Discharge: 2023-05-22 | Disposition: A | Discharge status: Home / Self Care | Payer: Medicare HMO | Source: Ambulatory Visit | Attending: Internal Medicine | Admitting: Internal Medicine

## 2023-05-22 ENCOUNTER — Other Ambulatory Visit: Payer: Self-pay | Admitting: Internal Medicine

## 2023-05-22 DIAGNOSIS — N6320 Unspecified lump in the left breast, unspecified quadrant: Secondary | ICD-10-CM

## 2023-09-06 ENCOUNTER — Encounter

## 2023-09-06 ENCOUNTER — Other Ambulatory Visit
